# Patient Record
Sex: Male | Born: 1940 | Race: White | Hispanic: Yes | Marital: Married | Smoking: Never smoker
Health system: Southern US, Community
[De-identification: ages and names within clinical notes are randomized; demographics above are authoritative.]

## PROBLEM LIST (undated history)

## (undated) DIAGNOSIS — E119 Type 2 diabetes mellitus without complications: Secondary | ICD-10-CM

## (undated) DIAGNOSIS — C801 Malignant (primary) neoplasm, unspecified: Secondary | ICD-10-CM

## (undated) HISTORY — DX: Type 2 diabetes mellitus without complications: E11.9

## (undated) HISTORY — DX: Malignant (primary) neoplasm, unspecified: C80.1

---

## 2015-12-01 ENCOUNTER — Ambulatory Visit (INDEPENDENT_AMBULATORY_CARE_PROVIDER_SITE_OTHER): Payer: Self-pay | Admitting: Family Medicine

## 2015-12-01 ENCOUNTER — Ambulatory Visit (INDEPENDENT_AMBULATORY_CARE_PROVIDER_SITE_OTHER): Payer: Self-pay

## 2015-12-01 VITALS — BP 118/72 | HR 85 | Temp 98.0°F | Resp 17 | Ht 68.0 in | Wt 183.0 lb

## 2015-12-01 DIAGNOSIS — E119 Type 2 diabetes mellitus without complications: Secondary | ICD-10-CM

## 2015-12-01 DIAGNOSIS — I10 Essential (primary) hypertension: Secondary | ICD-10-CM

## 2015-12-01 DIAGNOSIS — R1033 Periumbilical pain: Secondary | ICD-10-CM

## 2015-12-01 DIAGNOSIS — E785 Hyperlipidemia, unspecified: Secondary | ICD-10-CM

## 2015-12-01 LAB — POCT URINALYSIS DIP (MANUAL ENTRY)
Bilirubin, UA: NEGATIVE
Blood, UA: NEGATIVE
Glucose, UA: NEGATIVE
Leukocytes, UA: NEGATIVE
Nitrite, UA: NEGATIVE
Spec Grav, UA: 1.015
Urobilinogen, UA: 1
pH, UA: 5.5

## 2015-12-01 LAB — POCT CBC
Granulocyte percent: 64.2 %G (ref 37–80)
HCT, POC: 40.3 % — AB (ref 43.5–53.7)
Hemoglobin: 14 g/dL — AB (ref 14.1–18.1)
Lymph, poc: 2.7 (ref 0.6–3.4)
MCH, POC: 30.3 pg (ref 27–31.2)
MCHC: 34.9 g/dL (ref 31.8–35.4)
MCV: 87 fL (ref 80–97)
MID (cbc): 0.6 (ref 0–0.9)
MPV: 6.3 fL (ref 0–99.8)
POC Granulocyte: 6 (ref 2–6.9)
POC LYMPH PERCENT: 29 %L (ref 10–50)
POC MID %: 6.8 %M (ref 0–12)
Platelet Count, POC: 246 10*3/uL (ref 142–424)
RBC: 4.63 M/uL — AB (ref 4.69–6.13)
RDW, POC: 15.3 %
WBC: 9.4 10*3/uL (ref 4.6–10.2)

## 2015-12-01 LAB — POC MICROSCOPIC URINALYSIS (UMFC): Mucus: ABSENT

## 2015-12-01 MED ORDER — GEMFIBROZIL 600 MG PO TABS: 600.0000 mg | ORAL_TABLET | Freq: Two times a day (BID) | ORAL | Status: AC

## 2015-12-01 MED ORDER — METFORMIN HCL 850 MG PO TABS: 850.0000 mg | ORAL_TABLET | Freq: Two times a day (BID) | ORAL | Status: AC

## 2015-12-01 MED ORDER — LOSARTAN POTASSIUM 50 MG PO TABS: 50.0000 mg | ORAL_TABLET | Freq: Every day | ORAL | Status: AC

## 2015-12-01 NOTE — Progress Notes (Signed)
This is 75 year old man who is originally from [place]. He comes in with his wife who has a cough. He is accompanied by his 2 daughters.  Patient is complaining about periumbilical pain which she's had for 5 days. He had some diarrhea 2 days before that but then developed the bloating and abdominal pain which is continuing to have since. Last bowel movement was 2 yesterday.  Patient has a past medical history of diabetes, abdominal aneurysm repair, umbilical hernia repair, hyper lipidemia, and hypertension. He needs refills on his medications as well.  Patient denies any nausea or vomiting, fever, ongoing diarrhea. He's been taking omeprazole for the last 5 days without any help or benefit.  He describes the pain as burning.    Objective: BP 118/72 mmHg  Pulse 85  Temp(Src) 98 F (36.7 C) (Oral)  Resp 17  Ht 5\' 8"  (1.727 m)  Wt 183 lb (83.008 kg)  BMI 27.83 kg/m2  SpO2 94% HEENT unremarkable Extremities: Work on right forearm otherwise unremarkable Chest: Clear Heart: Regular no murmur Abdomen: Mildly distended with hyperactive bowel sounds and mild tenderness with deep palpation in the suprapubic area.  Results for orders placed or performed in visit on 12/01/15  POCT CBC  Result Value Ref Range   WBC 9.4 4.6 - 10.2 K/uL   Lymph, poc 2.7 0.6 - 3.4   POC LYMPH PERCENT 29.0 10 - 50 %L   MID (cbc) 0.6 0 - 0.9   POC MID % 6.8 0 - 12 %M   POC Granulocyte 6.0 2 - 6.9   Granulocyte percent 64.2 37 - 80 %G   RBC 4.63 (A) 4.69 - 6.13 M/uL   Hemoglobin 14.0 (A) 14.1 - 18.1 g/dL   HCT, POC 40.3 (A) 43.5 - 53.7 %   MCV 87.0 80 - 97 fL   MCH, POC 30.3 27 - 31.2 pg   MCHC 34.9 31.8 - 35.4 g/dL   RDW, POC 15.3 %   Platelet Count, POC 246 142 - 424 K/uL   MPV 6.3 0 - 99.8 fL  POCT urinalysis dipstick  Result Value Ref Range   Color, UA yellow yellow   Clarity, UA clear clear   Glucose, UA negative negative   Bilirubin, UA negative negative   Ketones, POC UA trace (5) (A) negative    Spec Grav, UA 1.015    Blood, UA negative negative   pH, UA 5.5    Protein Ur, POC trace (A) negative   Urobilinogen, UA 1.0    Nitrite, UA Negative Negative   Leukocytes, UA Negative Negative  POCT Microscopic Urinalysis ([facility])  Result Value Ref Range   WBC,UR,HPF,POC None None WBC/hpf   RBC,UR,HPF,POC None None RBC/hpf   Bacteria None None, Too numerous to count   Mucus Absent Absent   Epithelial Cells, UR Per Microscopy None None, Too numerous to count cells/hpf   Abdominal flat plate: No acute changes, moderate stool burden  Assessment: The patient probably has retained stool. Don't see any other cause of his abdominal pain on either the x-ray or lab tests.   Periumbilical abdominal pain - Plan: DG Abd 1 View, POCT CBC, POCT urinalysis dipstick, POCT Microscopic Urinalysis ([facility])  Type 2 diabetes mellitus without complication, without long-term current use of insulin (HCC) - Plan: metFORMIN (GLUCOPHAGE) 850 MG tablet  Hyperlipidemia - Plan: gemfibrozil (LOPID) 600 MG tablet  Essential hypertension - Plan: losartan (COZAAR) 50 MG tablet MiraLAX for now, return or go to the emergency room if not seeing improvement  in the next 24-48 hours.  Robyn Haber, MD

## 2015-12-01 NOTE — Patient Instructions (Addendum)
Please return in 24-48 hours if the pain is not subsiding.  Take the Miralax powder, one scoop per 8 oz water, twice daily starting tonight to help with the constipation.

## 2017-09-09 IMAGING — CR DG ABDOMEN 1V
2 series · 2 of 2 positions shown · non-contrast
Comparison: None.

CLINICAL DATA: Acute onset of periumbilical abdominal pain. Initial
encounter.

EXAM:
ABDOMEN - 1 VIEW

[AP (1 of 2)]
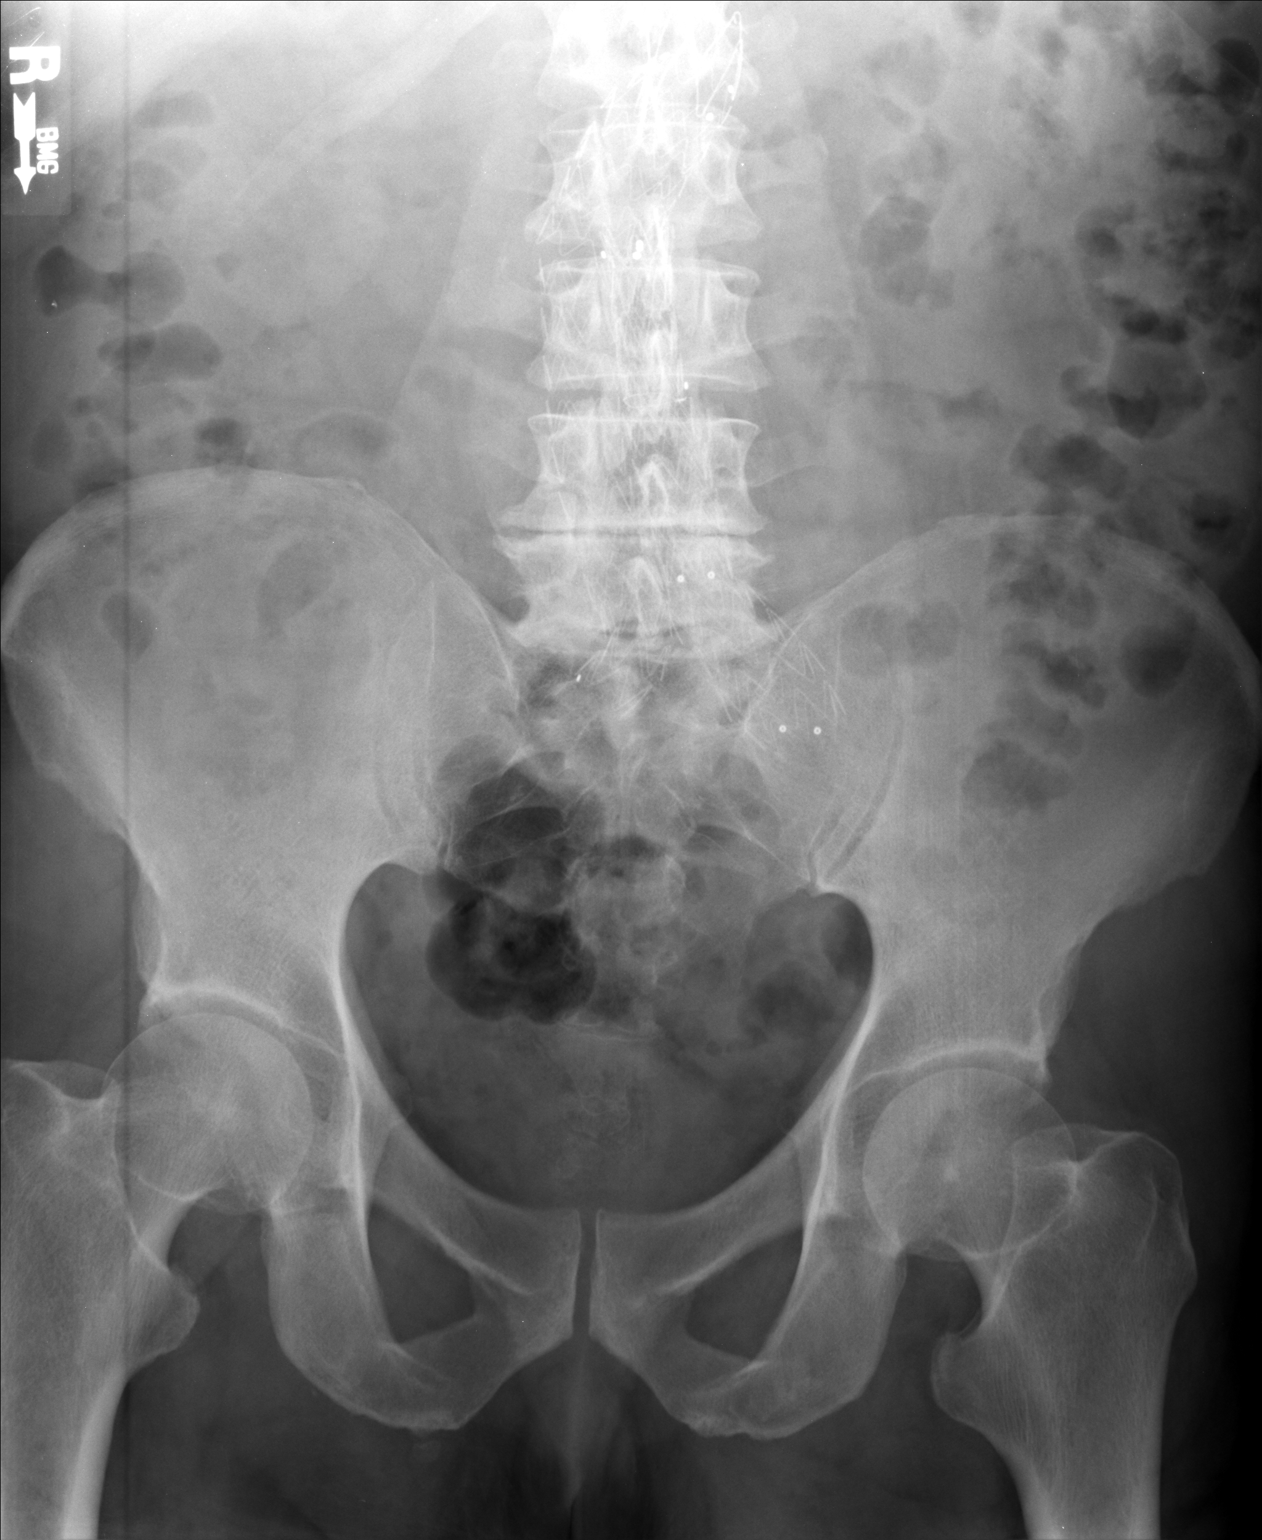

[AP (2 of 2)]
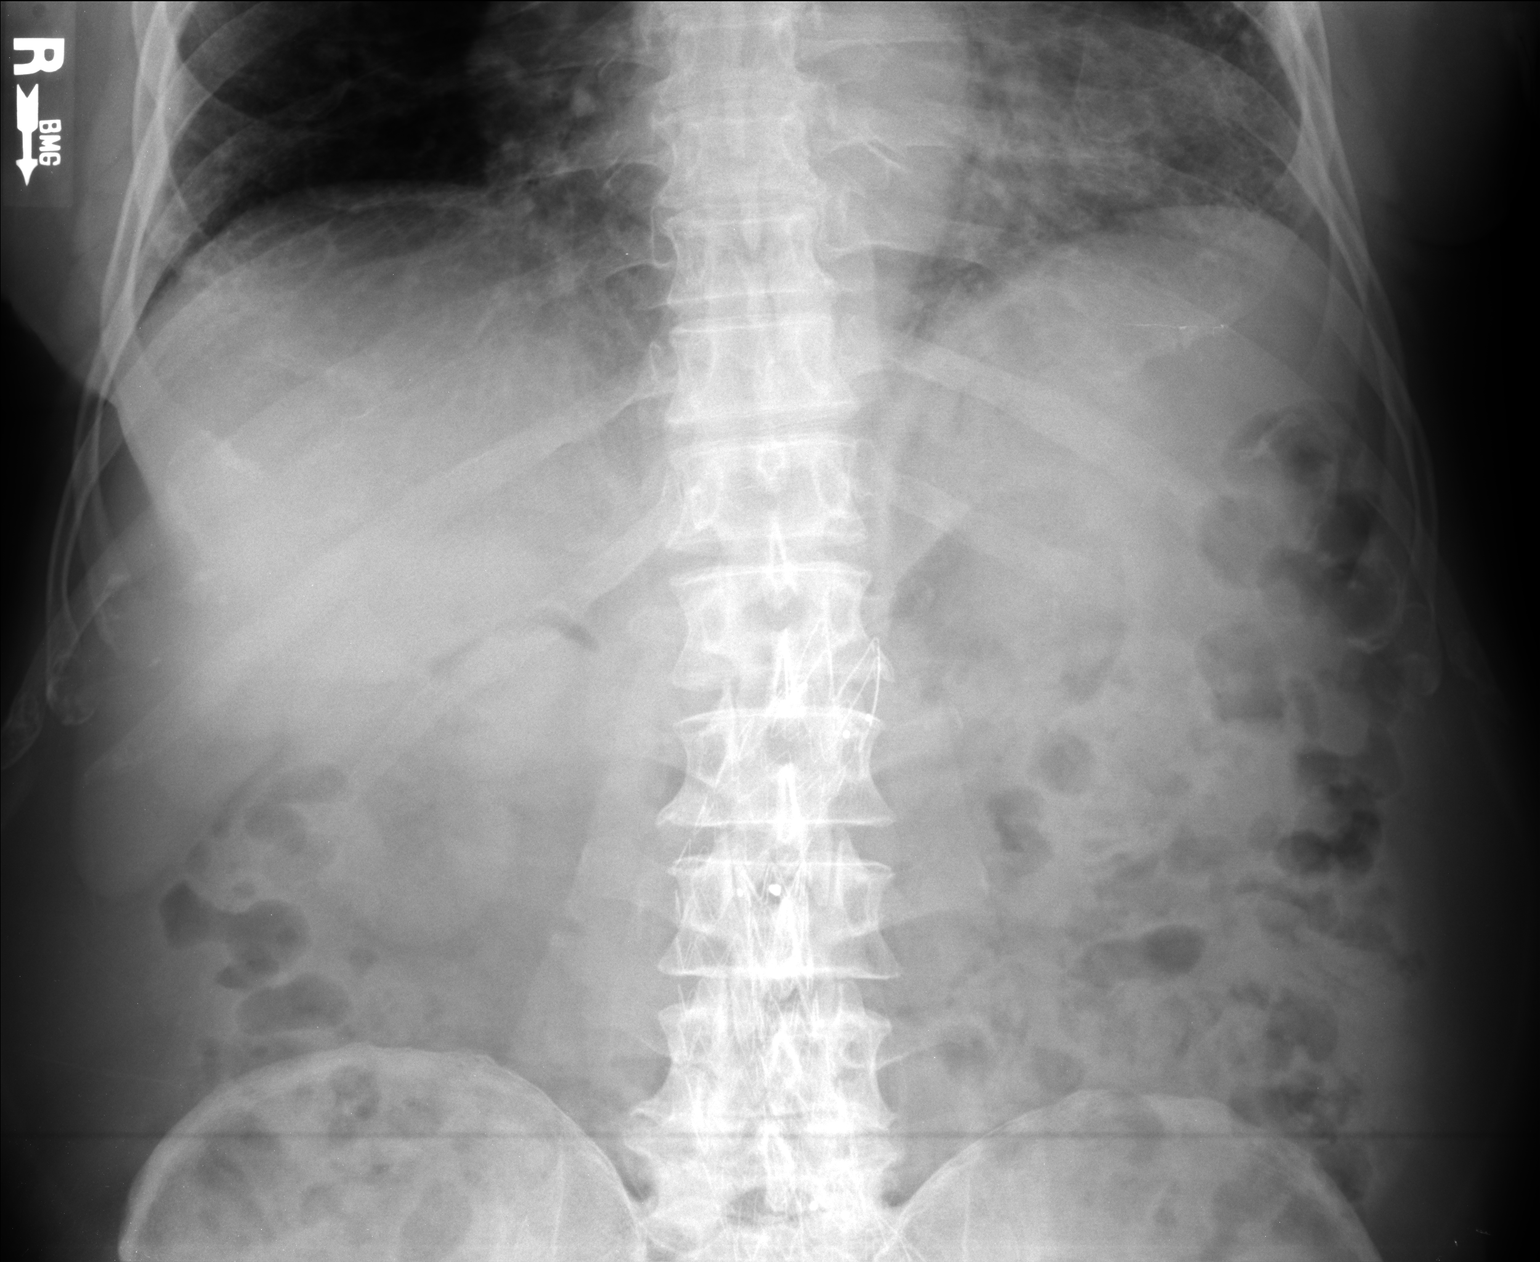

[2 of 2 positions shown; findings below may reference images not displayed]

FINDINGS: The visualized bowel gas pattern is unremarkable. Scattered air and
stool filled loops of colon are seen; no abnormal dilatation of
small bowel loops is seen to suggest small bowel obstruction. No
free intra-abdominal air is identified, though evaluation for free
air is limited on a single supine view.

An aortoiliac stent graft is noted.

Mild degenerative change is noted at the lower lumbar spine; the
sacroiliac joints are unremarkable in appearance. The visualized
lung bases are essentially clear.
IMPRESSION: Unremarkable bowel gas pattern; no free intra-abdominal air seen.
Small to moderate amount of stool noted in the colon.
